# Patient Record
Sex: Male | Born: 1990 | Race: Black or African American | Hispanic: No | Marital: Single | Smoking: Never smoker
Health system: Southern US, Community
[De-identification: ages and names within clinical notes are randomized; demographics above are authoritative.]

## PROBLEM LIST (undated history)

## (undated) DIAGNOSIS — M1990 Unspecified osteoarthritis, unspecified site: Secondary | ICD-10-CM

---

## 2012-11-14 ENCOUNTER — Emergency Department (HOSPITAL_COMMUNITY)
Admission: EM | Admit: 2012-11-14 | Discharge: 2012-11-14 | Disposition: A | Discharge status: Home / Self Care | Payer: BC Managed Care – PPO | Attending: Emergency Medicine | Admitting: Emergency Medicine

## 2012-11-14 DIAGNOSIS — S61209A Unspecified open wound of unspecified finger without damage to nail, initial encounter: Secondary | ICD-10-CM | POA: Insufficient documentation

## 2012-11-14 DIAGNOSIS — Y929 Unspecified place or not applicable: Secondary | ICD-10-CM | POA: Insufficient documentation

## 2012-11-14 DIAGNOSIS — Y93G9 Activity, other involving cooking and grilling: Secondary | ICD-10-CM | POA: Insufficient documentation

## 2012-11-14 DIAGNOSIS — S61219A Laceration without foreign body of unspecified finger without damage to nail, initial encounter: Secondary | ICD-10-CM

## 2012-11-14 DIAGNOSIS — Z23 Encounter for immunization: Secondary | ICD-10-CM | POA: Insufficient documentation

## 2012-11-14 DIAGNOSIS — W268XXA Contact with other sharp object(s), not elsewhere classified, initial encounter: Secondary | ICD-10-CM | POA: Insufficient documentation

## 2012-11-14 MED ORDER — TETANUS-DIPHTH-ACELL PERTUSSIS 5-2.5-18.5 LF-MCG/0.5 IM SUSP
0.5000 mL | Freq: Once | INTRAMUSCULAR | Status: AC
  Administered 2012-11-14: 0.5 mL via INTRAMUSCULAR
  Filled 2012-11-14: qty 0.5

## 2012-11-14 MED ORDER — HYDROCODONE-ACETAMINOPHEN 5-325 MG PO TABS: 2.0000 | ORAL_TABLET | ORAL | Status: DC | PRN

## 2012-11-14 NOTE — ED Provider Notes (Signed)
History     CSN: [id number]  Arrival date & time 11/14/12  6295   First MD Initiated Contact with Patient 11/14/12 1004      Chief Complaint  Patient presents with  . Laceration    (Consider location/radiation/quality/duration/timing/severity/associated sxs/prior treatment) HPI Comments: Patient is a 21 year old male who presents with a laceration of his right thumb. The laceration occurred when he was using a mandolin slicer for cooking and accidentally cut his thumb. Patient reports sudden onset of mild, aching pain of his right thumb that does not radiate. Patient reports associated bleeding. He denies any weakness, numbness/tingling of his affected finger. Patient reports covering the wound to control bleeding. No aggravating/alleviating factors. No associated symptoms.    No past medical history on file.  No past surgical history on file.  No family history on file.  History  Substance Use Topics  . Smoking status: Not on file  . Smokeless tobacco: Not on file  . Alcohol Use: Not on file      Review of Systems  Skin: Positive for wound.  All other systems reviewed and are negative.    Allergies  Review of patient's allergies indicates no known allergies.  Home Medications   Current Outpatient Rx  Name  Route  Sig  Dispense  Refill  . IBUPROFEN 200 MG PO TABS   Oral   Take 400 mg by mouth every 6 (six) hours as needed. Headache           BP 156/82  Pulse 64  Temp 98.2 F (36.8 C) (Oral)  Resp 16  SpO2 100%  Physical Exam  Nursing note and vitals reviewed. Constitutional: He is oriented to person, place, and time. He appears well-developed and well-nourished. No distress.  HENT:  Head: Normocephalic and atraumatic.  Eyes: Conjunctivae normal and EOM are normal.  Neck: Normal range of motion.  Cardiovascular: Normal rate and regular rhythm.  Exam reveals no gallop and no friction rub.   No murmur heard. Pulmonary/Chest: Effort normal and breath  sounds normal. He has no wheezes. He has no rales. He exhibits no tenderness.  Abdominal: Soft. He exhibits no distension.  Musculoskeletal: Normal range of motion.       Affected finger tender to palpation at wound site. Full ROM of right thumb at each joint.   Neurological: He is alert and oriented to person, place, and time. Coordination normal.       Strength and sensation equal and intact bilaterally. Speech is goal-oriented. Moves limbs without ataxia.   Skin: Skin is warm and dry. He is not diaphoretic.       1.5 cm laceration to distal right thumb. Bleeding controlled.   Psychiatric: He has a normal mood and affect. His behavior is normal.    ED Course  Procedures (including critical care time)  LACERATION REPAIR Performed by: Emilia Beck Authorized by: Emilia Beck Consent: Verbal consent obtained. Risks and benefits: risks, benefits and alternatives were discussed Consent given by: patient Patient identity confirmed: provided demographic data Prepped and Draped in normal sterile fashion Wound explored  Laceration Location: right thumb  Laceration Length: 1.5 cm  No Foreign Bodies seen or palpated  Anesthesia: digital block  Local anesthetic: lidocaine 2% without epinephrine  Anesthetic total: 3 ml  Irrigation method: syringe Amount of cleaning: standard  Skin closure: 3-0 prolene  Number of sutures: 4  Technique: simple  Patient tolerance: Patient tolerated the procedure well with no immediate complications.   Labs Reviewed -  No data to display No results found.   1. Laceration of finger       MDM  11:26 AM Laceration repaired without difficulty. No neurovascular compromise. Patient given tdap. Patient will return in 10 days for suture removal. He will be discharged with pain medication and no further evaluation.         Emilia Beck, [place] 11/15/12 [phone]

## 2012-11-14 NOTE — ED Notes (Signed)
Pt has laceration to right thumb going through nail bed. Suture cart at bedside.

## 2012-11-15 NOTE — ED Provider Notes (Signed)
Medical screening examination/treatment/procedure(s) were performed by non-physician practitioner and as supervising physician I was immediately available for consultation/collaboration.   Lyanne Co, MD 11/15/12 [phone]

## 2013-12-31 ENCOUNTER — Emergency Department (HOSPITAL_COMMUNITY): Payer: BC Managed Care – PPO

## 2013-12-31 ENCOUNTER — Encounter (HOSPITAL_COMMUNITY): Payer: Self-pay | Admitting: Emergency Medicine

## 2013-12-31 ENCOUNTER — Emergency Department (HOSPITAL_COMMUNITY)
Admission: EM | Admit: 2013-12-31 | Discharge: 2013-12-31 | Disposition: A | Discharge status: Home / Self Care | Payer: BC Managed Care – PPO | Attending: Emergency Medicine | Admitting: Emergency Medicine

## 2013-12-31 DIAGNOSIS — K5289 Other specified noninfective gastroenteritis and colitis: Secondary | ICD-10-CM | POA: Insufficient documentation

## 2013-12-31 DIAGNOSIS — M129 Arthropathy, unspecified: Secondary | ICD-10-CM | POA: Insufficient documentation

## 2013-12-31 DIAGNOSIS — K529 Noninfective gastroenteritis and colitis, unspecified: Secondary | ICD-10-CM

## 2013-12-31 DIAGNOSIS — H9209 Otalgia, unspecified ear: Secondary | ICD-10-CM | POA: Insufficient documentation

## 2013-12-31 HISTORY — DX: Unspecified osteoarthritis, unspecified site: M19.90

## 2013-12-31 LAB — CBC WITH DIFFERENTIAL/PLATELET
BASOS ABS: 0 10*3/uL (ref 0.0–0.1)
BASOS PCT: 0 % (ref 0–1)
EOS PCT: 1 % (ref 0–5)
Eosinophils Absolute: 0.1 10*3/uL (ref 0.0–0.7)
HEMATOCRIT: 48.9 % (ref 39.0–52.0)
Hemoglobin: 16.1 g/dL (ref 13.0–17.0)
Lymphocytes Relative: 20 % (ref 12–46)
Lymphs Abs: 1.3 10*3/uL (ref 0.7–4.0)
MCH: 28.6 pg (ref 26.0–34.0)
MCHC: 32.9 g/dL (ref 30.0–36.0)
MCV: 86.9 fL (ref 78.0–100.0)
MONO ABS: 0.8 10*3/uL (ref 0.1–1.0)
Monocytes Relative: 13 % — ABNORMAL HIGH (ref 3–12)
Neutro Abs: 4.3 10*3/uL (ref 1.7–7.7)
Neutrophils Relative %: 66 % (ref 43–77)
Platelets: 202 10*3/uL (ref 150–400)
RBC: 5.63 MIL/uL (ref 4.22–5.81)
RDW: 12.9 % (ref 11.5–15.5)
WBC: 6.5 10*3/uL (ref 4.0–10.5)

## 2013-12-31 LAB — COMPREHENSIVE METABOLIC PANEL
ALBUMIN: 4.6 g/dL (ref 3.5–5.2)
ALT: 28 U/L (ref 0–53)
AST: 43 U/L — AB (ref 0–37)
Alkaline Phosphatase: 78 U/L (ref 39–117)
BUN: 10 mg/dL (ref 6–23)
CALCIUM: 9.8 mg/dL (ref 8.4–10.5)
CO2: 26 mEq/L (ref 19–32)
CREATININE: 1.29 mg/dL (ref 0.50–1.35)
Chloride: 101 mEq/L (ref 96–112)
GFR calc Af Amer: 89 mL/min — ABNORMAL LOW (ref >90)
GFR calc non Af Amer: 77 mL/min — ABNORMAL LOW (ref >90)
Glucose, Bld: 109 mg/dL — ABNORMAL HIGH (ref 70–99)
Potassium: 4.9 mEq/L (ref 3.7–5.3)
Sodium: 141 mEq/L (ref 137–147)
TOTAL PROTEIN: 8.5 g/dL — AB (ref 6.0–8.3)
Total Bilirubin: 1 mg/dL (ref 0.3–1.2)

## 2013-12-31 LAB — LIPASE, BLOOD: Lipase: 22 U/L (ref 11–59)

## 2013-12-31 MED ORDER — MORPHINE SULFATE 4 MG/ML IJ SOLN
4.0000 mg | Freq: Once | INTRAMUSCULAR | Status: AC
  Administered 2013-12-31: 4 mg via INTRAVENOUS
  Filled 2013-12-31: qty 1

## 2013-12-31 MED ORDER — ONDANSETRON HCL 4 MG/2ML IJ SOLN
4.0000 mg | Freq: Once | INTRAMUSCULAR | Status: AC
  Administered 2013-12-31: 4 mg via INTRAVENOUS
  Filled 2013-12-31: qty 2

## 2013-12-31 MED ORDER — HYDROCODONE-ACETAMINOPHEN 5-325 MG PO TABS: 1.0000 | ORAL_TABLET | ORAL | Status: AC | PRN

## 2013-12-31 MED ORDER — ONDANSETRON 8 MG PO TBDP: ORAL_TABLET | ORAL | Status: AC

## 2013-12-31 MED ORDER — IOHEXOL 300 MG/ML  SOLN
100.0000 mL | Freq: Once | INTRAMUSCULAR | Status: AC | PRN
  Administered 2013-12-31: 100 mL via INTRAVENOUS

## 2013-12-31 MED ORDER — SODIUM CHLORIDE 0.9 % IV BOLUS (SEPSIS)
1000.0000 mL | Freq: Once | INTRAVENOUS | Status: AC
  Administered 2013-12-31: 1000 mL via INTRAVENOUS

## 2013-12-31 MED ORDER — IOHEXOL 300 MG/ML  SOLN
50.0000 mL | Freq: Once | INTRAMUSCULAR | Status: AC | PRN
  Administered 2013-12-31: 50 mL via ORAL

## 2013-12-31 NOTE — ED Provider Notes (Signed)
CSN: [id number]     Arrival date & time 12/31/13  1501 History   First MD Initiated Contact with Patient 12/31/13 1540     Chief Complaint  Patient presents with  . Abdominal Pain   (Consider location/radiation/quality/duration/timing/severity/associated sxs/prior Treatment) The history is provided by the patient and medical records. No language interpreter was used.    Brandon Noble is a 23 y.o. male  with a hx of arthritis presents to the Emergency Department complaining of gradual, persistent, progressively worsening periumbilical and RLQ abd pain with associated watery diarrhea x 7 today onset Friday evening (2 days).  Pt reports subjective fevers and chills.  He reports that the initial pain was in his lower back with associated myalgias.  Pt reports intense cardiac activity and he thought he was just sore and coming down with the flu.  He denies getting a flu shot and his entire office has had it.  Associated symptoms include L ear otalgia/ringing. Pt reports he took dayquil and imodium without relief.  Nothing makes it better and pushing on the abd makes it worse.  Pt denies headache, neck pain, chest pain, SOB, cough, nasal congestion, ST, vomiting, weakness, dizziness, syncope, dysuria.      Past Medical History  Diagnosis Date  . Arthritis     childhood   History reviewed. No pertinent past surgical history. No family history on file. History  Substance Use Topics  . Smoking status: Never Smoker   . Smokeless tobacco: Not on file  . Alcohol Use: Yes     Comment: social    Review of Systems  Constitutional: Negative for fever, diaphoresis, appetite change, fatigue and unexpected weight change.  HENT: Negative for mouth sores and trouble swallowing.   Respiratory: Negative for cough, chest tightness, shortness of breath, wheezing and stridor.   Cardiovascular: Negative for chest pain and palpitations.  Gastrointestinal: Positive for nausea, abdominal pain and diarrhea. Negative  for vomiting, constipation, blood in stool, abdominal distention and rectal pain.  Genitourinary: Negative for dysuria, urgency, frequency, hematuria, flank pain and difficulty urinating.  Musculoskeletal: Positive for back pain and myalgias. Negative for neck pain and neck stiffness.  Skin: Negative for rash.  Neurological: Negative for weakness.  Hematological: Negative for adenopathy.  Psychiatric/Behavioral: Negative for confusion.  All other systems reviewed and are negative.    Allergies  Review of patient's allergies indicates no known allergies.  Home Medications   Current Outpatient Rx  Name  Route  Sig  Dispense  Refill  . alum & mag hydroxide-simeth (MAALOX/MYLANTA) 200-200-20 MG/5ML suspension   Oral   Take 30 mLs by mouth every 6 (six) hours as needed for indigestion or heartburn.         . Pseudoephedrine-APAP-DM (DAYQUIL MULTI-SYMPTOM COLD/FLU PO)   Oral   Take 30 mLs by mouth every 6 (six) hours as needed (flu symptoms).         Marland Kitchen HYDROcodone-acetaminophen (NORCO/VICODIN) 5-325 MG per tablet   Oral   Take 1-2 tablets by mouth every 4 (four) hours as needed.   10 tablet   0   . ondansetron (ZOFRAN ODT) 8 MG disintegrating tablet      8mg  ODT q4 hours prn nausea   10 tablet   0    BP 139/79  Pulse 83  Temp(Src) 98.1 F (36.7 C)  Resp 20  Ht 6' (1.829 m)  Wt 265 lb (120.203 kg)  BMI 35.93 kg/m2  SpO2 98% Physical Exam  Nursing note and vitals reviewed. Constitutional:  He is oriented to person, place, and time. He appears well-developed and well-nourished. No distress.  Awake, alert, nontoxic appearance  HENT:  Head: Normocephalic and atraumatic.  Right Ear: Tympanic membrane, external ear and ear canal normal.  Left Ear: Tympanic membrane, external ear and ear canal normal.  Nose: Nose normal. No mucosal edema or rhinorrhea.  Mouth/Throat: Uvula is midline, oropharynx is clear and moist and mucous membranes are normal. Mucous membranes are not  dry. No uvula swelling. No oropharyngeal exudate, posterior oropharyngeal edema, posterior oropharyngeal erythema or tonsillar abscesses.  Eyes: Conjunctivae are normal. No scleral icterus.  Neck: Normal range of motion. Neck supple.  Cardiovascular: Normal rate, regular rhythm, normal heart sounds and intact distal pulses.   No murmur heard. No tachycardia  Pulmonary/Chest: Effort normal and breath sounds normal. No respiratory distress. He has no wheezes.  Clear and equal breath sounds  Abdominal: Soft. Bowel sounds are normal. He exhibits no distension and no mass. There is tenderness in the right upper quadrant, right lower quadrant and periumbilical area. There is guarding. There is no rebound and no CVA tenderness.  Patient with tenderness to palpation along the right lateral side of the abdomen and around the period the local area, some guarding with exam. No CVA tenderness or rebound tenderness  No abdominal pain with heeltap, no peritoneal signs  Musculoskeletal: Normal range of motion. He exhibits no edema.  Neurological: He is alert and oriented to person, place, and time. He exhibits normal muscle tone. Coordination normal.  Speech is clear and goal oriented Moves extremities without ataxia  Skin: Skin is warm and dry. He is not diaphoretic. No erythema.  Psychiatric: He has a normal mood and affect.    ED Course  Procedures (including critical care time) Labs Review Labs Reviewed  CBC WITH DIFFERENTIAL - Abnormal; Notable for the following:    Monocytes Relative 13 (*)    All other components within normal limits  COMPREHENSIVE METABOLIC PANEL - Abnormal; Notable for the following:    Glucose, Bld 109 (*)    Total Protein 8.5 (*)    AST 43 (*)    GFR calc non Af Amer 77 (*)    GFR calc Af Amer 89 (*)    All other components within normal limits  LIPASE, BLOOD  URINALYSIS, ROUTINE W REFLEX MICROSCOPIC   Imaging Review Ct Abdomen Pelvis W Contrast  12/31/2013    CLINICAL DATA:  Right lower quadrant pain, fever  EXAM: CT ABDOMEN AND PELVIS WITH CONTRAST  TECHNIQUE: Multidetector CT imaging of the abdomen and pelvis was performed using the standard protocol following bolus administration of intravenous contrast. Sagittal and coronal MPR images reconstructed from axial data set.  CONTRAST:  OMNIPAQUE IOHEXOL 300 MG/ML SOLN. No/insufficient oral contrast.  COMPARISON:  None  FINDINGS: Lung bases clear.  Liver, spleen, pancreas, kidneys, and adrenal glands normal appearance.  Fluid filled normal upper normal caliber small bowel loops throughout abdomen.  Scattered fluid throughout colon.  No hydronephrosis or ureteral dilatation.  Bladder unremarkable.  Appendix not visualized but no pericecal inflammatory process identified.  No mass, adenopathy, free fluid or free air.  Bones unremarkable.  IMPRESSION: Nonspecific fluid filled nondistended large and small bowel loops throughout abdomen.  No other intra-abdominal or intrapelvic abnormalities identified.   Electronically Signed   By: Ulyses Southward M.D.   On: 12/31/2013 17:28    EKG Interpretation   None       MDM   1. Gastroenteritis  Gaylyn CheersDevin Bechtel presents with right-sided abdominal pain from urgent care after 2 days of back pain and watery diarrhea. He denies nausea or vomiting. Subjective fevers at home.  Patient is afebrile and not tachycardic here.  He relates concerns that he had the flu as he has been exposed to it however he presents without URI symptoms.  Patient now reports right upper quadrant pain however patient relates right lower quadrant pain.  Patient with tenderness along the entire right lateral side of the abdomen, worse in the right lower quadrant. Plan for fluids, pain control, basic labs and CT abd pelvis.    6:11 PM Pt with resolution of symptoms after pain control, nausea control and fluids.  On reevaluation abdomen soft and nontender.  Patient without leukocytosis, CMP  unremarkable, lipase within normal limits and CT abdomen pelvis without evidence of diverticulitis, cholecystitis or appendicitis.  Fluid-filled small bowel loops, nondistended.  I personally reviewed the imaging tests through PACS system.  I reviewed available ER/hospitalization records through the EMR.    Patient with symptoms consistent with viral gastroenteritis.  Vitals are stable, no fever.  Patient is nontoxic, nonseptic appearing, in no apparent distress.  Patient does not meet the SIRS or Sepsis criteria.  Pt's symptoms have been managed in the department; fluid bolus given.  No signs of dehydration, tolerating PO fluids > 6 oz.  Lungs are clear.  No focal abdominal pain, no peritoneal signs, no concern for appendicitis, cholecystitis, pancreatitis, ruptured viscus, UTI, kidney stone.  Supportive therapy indicated with return if symptoms worsen.  Patient counseled.  I have also discussed reasons to return immediately to the ER.  Patient expresses understanding and agrees with plan.  It has been determined that no acute conditions requiring further emergency intervention are present at this time. The patient/guardian have been advised of the diagnosis and plan. We have discussed signs and symptoms that warrant return to the ED, such as changes or worsening in symptoms.   Vital signs are stable at discharge.   BP 139/79  Pulse 83  Temp(Src) 98.1 F (36.7 C)  Resp 20  Ht 6' (1.829 m)  Wt 265 lb (120.203 kg)  BMI 35.93 kg/m2  SpO2 98%  Patient/guardian has voiced understanding and agreed to follow-up with the PCP or specialist.        Dierdre ForthHannah Latoya Diskin, PA-C 12/31/13 1813  Dierdre ForthHannah Lenyx Boody, PA-C 12/31/13 1815

## 2013-12-31 NOTE — ED Notes (Signed)
Pt attempted to urinate but was unsuccessful at this time

## 2013-12-31 NOTE — ED Notes (Signed)
Pt from [facility] to be evaluated for RUQ pain that radiates to back x2 days with diarrhea, denies N/V. Pt is A&O and in NAD

## 2013-12-31 NOTE — Discharge Instructions (Signed)
1. Medications: zofran for nausea, Vicodin for pain, usual home medications 2. Treatment: rest, drink plenty of fluids, imodium for diarrhea as needed 3. Follow Up: Please followup with your primary doctor for discussion of your diagnoses and further evaluation after today's visit; if you do not have a primary care doctor use the resource guide provided to find one; return to the Ed for worsening or concerning symptoms  Viral Gastroenteritis Viral gastroenteritis is also known as stomach flu. This condition affects the stomach and intestinal tract. It can cause sudden diarrhea and vomiting. The illness typically lasts 3 to 8 days. Most people develop an immune response that eventually gets rid of the virus. While this natural response develops, the virus can make you quite ill. CAUSES  Many different viruses can cause gastroenteritis, such as rotavirus or noroviruses. You can catch one of these viruses by consuming contaminated food or water. You may also catch a virus by sharing utensils or other personal items with an infected person or by touching a contaminated surface. SYMPTOMS  The most common symptoms are diarrhea and vomiting. These problems can cause a severe loss of body fluids (dehydration) and a body salt (electrolyte) imbalance. Other symptoms may include:  Fever.  Headache.  Fatigue.  Abdominal pain. DIAGNOSIS  Your caregiver can usually diagnose viral gastroenteritis based on your symptoms and a physical exam. A stool sample may also be taken to test for the presence of viruses or other infections. TREATMENT  This illness typically goes away on its own. Treatments are aimed at rehydration. The most serious cases of viral gastroenteritis involve vomiting so severely that you are not able to keep fluids down. In these cases, fluids must be given through an intravenous line (IV). HOME CARE INSTRUCTIONS   Drink enough fluids to keep your urine clear or pale yellow. Drink small  amounts of fluids frequently and increase the amounts as tolerated.  Ask your caregiver for specific rehydration instructions.  Avoid:  Foods high in sugar.  Alcohol.  Carbonated drinks.  Tobacco.  Juice.  Caffeine drinks.  Extremely hot or cold fluids.  Fatty, greasy foods.  Too much intake of anything at one time.  Dairy products until 24 to 48 hours after diarrhea stops.  You may consume probiotics. Probiotics are active cultures of beneficial bacteria. They may lessen the amount and number of diarrheal stools in adults. Probiotics can be found in yogurt with active cultures and in supplements.  Wash your hands well to avoid spreading the virus.  Only take over-the-counter or prescription medicines for pain, discomfort, or fever as directed by your caregiver. Do not give aspirin to children. Antidiarrheal medicines are not recommended.  Ask your caregiver if you should continue to take your regular prescribed and over-the-counter medicines.  Keep all follow-up appointments as directed by your caregiver. SEEK IMMEDIATE MEDICAL CARE IF:   You are unable to keep fluids down.  You do not urinate at least once every 6 to 8 hours.  You develop shortness of breath.  You notice blood in your stool or vomit. This may look like coffee grounds.  You have abdominal pain that increases or is concentrated in one small area (localized).  You have persistent vomiting or diarrhea.  You have a fever.  The patient is a child younger than 3 months, and he or she has a fever.  The patient is a child older than 3 months, and he or she has a fever and persistent symptoms.  The patient is  a child older than 3 months, and he or she has a fever and symptoms suddenly get worse.  The patient is a baby, and he or she has no tears when crying. MAKE SURE YOU:   Understand these instructions.  Will watch your condition.  Will get help right away if you are not doing well or get  worse. Document Released: 11/16/2005 Document Revised: 02/08/2012 Document Reviewed: 09/02/2011 [facility] Patient Information 2014 [place], [place].   Diet for Diarrhea, Adult Frequent, runny stools (diarrhea) may be caused or worsened by food or drink. Diarrhea may be relieved by changing your diet. Since diarrhea can last up to 7 days, it is easy for you to lose too much fluid from the body and become dehydrated. Fluids that are lost need to be replaced. Along with a modified diet, make sure you drink enough fluids to keep your urine clear or pale yellow. DIET INSTRUCTIONS  Ensure adequate fluid intake (hydration): have 1 cup (8 oz) of fluid for each diarrhea episode. Avoid fluids that contain simple sugars or sports drinks, fruit juices, whole milk products, and sodas. Your urine should be clear or pale yellow if you are drinking enough fluids. Hydrate with an oral rehydration solution that you can purchase at pharmacies, retail stores, and online. You can prepare an oral rehydration solution at home by mixing the following ingredients together:    tsp table salt.   tsp baking soda.   tsp salt substitute containing potassium chloride.  1  tablespoons sugar.  1 L (34 oz) of water.  Certain foods and beverages may increase the speed at which food moves through the gastrointestinal (GI) tract. These foods and beverages should be avoided and include:  Caffeinated and alcoholic beverages.  High-fiber foods, such as raw fruits and vegetables, nuts, seeds, and whole grain breads and cereals.  Foods and beverages sweetened with sugar alcohols, such as xylitol, sorbitol, and mannitol.  Some foods may be well tolerated and may help thicken stool including:  Starchy foods, such as rice, toast, pasta, low-sugar cereal, oatmeal, grits, baked potatoes, crackers, and bagels.   Bananas.   Applesauce.  Add probiotic-rich foods to help increase healthy bacteria in the GI tract, such as yogurt  and fermented milk products. RECOMMENDED FOODS AND BEVERAGES Starches Choose foods with less than 2 g of fiber per serving.  Recommended:  White, [place], and pita breads, plain rolls, buns, bagels. Plain muffins, matzo. Soda, saltine, or graham crackers. Pretzels, melba toast, zwieback. Cooked cereals made with water: cornmeal, farina, cream cereals. Dry cereals: refined corn, wheat, rice. Potatoes prepared any way without skins, refined macaroni, spaghetti, noodles, refined rice.  Avoid:  Bread, rolls, or crackers made with whole wheat, multi-grains, rye, bran seeds, nuts, or coconut. Corn tortillas or taco shells. Cereals containing whole grains, multi-grains, bran, coconut, nuts, raisins. Cooked or dry oatmeal. Coarse wheat cereals, granola. Cereals advertised as "high-fiber." Potato skins. Whole grain pasta, wild or brown rice. Popcorn. Sweet potatoes, yams. Sweet rolls, doughnuts, waffles, pancakes, sweet breads. Vegetables  Recommended: Strained tomato and vegetable juices. Most well-cooked and canned vegetables without seeds. Fresh: Tender lettuce, cucumber without the skin, cabbage, spinach, bean sprouts.  Avoid: Fresh, cooked, or canned: Artichokes, baked beans, beet greens, broccoli, Brussels sprouts, corn, kale, legumes, peas, sweet potatoes. Cooked: Green or red cabbage, spinach. Avoid large servings of any vegetables because vegetables shrink when cooked, and they contain more fiber per serving than fresh vegetables. Fruit  Recommended: Cooked or canned: Apricots, applesauce, cantaloupe, cherries,  fruit cocktail, grapefruit, grapes, kiwi, mandarin oranges, peaches, pears, plums, watermelon. Fresh: Apples without skin, ripe banana, grapes, cantaloupe, cherries, grapefruit, peaches, oranges, plums. Keep servings limited to  cup or 1 piece.  Avoid: Fresh: Apples with skin, apricots, mangoes, pears, raspberries, strawberries. Prune juice, stewed or dried prunes. Dried fruits, raisins,  dates. Large servings of all fresh fruits. Protein  Recommended: Ground or well-cooked tender beef, ham, veal, lamb, pork, or poultry. Eggs. Fish, oysters, shrimp, lobster, other seafoods. Liver, organ meats.  Avoid: Tough, fibrous meats with gristle. Peanut butter, smooth or chunky. Cheese, nuts, seeds, legumes, dried peas, beans, lentils. Dairy  Recommended: Yogurt, lactose-free milk, kefir, drinkable yogurt, buttermilk, soy milk, or plain hard cheese.  Avoid: Milk, chocolate milk, beverages made with milk, such as milkshakes. Soups  Recommended: Bouillon, broth, or soups made from allowed foods. Any strained soup.  Avoid: Soups made from vegetables that are not allowed, cream or milk-based soups. Desserts and Sweets  Recommended: Sugar-free gelatin, sugar-free frozen ice pops made without sugar alcohol.  Avoid: Plain cakes and cookies, pie made with fruit, pudding, custard, cream pie. Gelatin, fruit, ice, sherbet, frozen ice pops. Ice cream, ice milk without nuts. Plain hard candy, honey, jelly, molasses, syrup, sugar, chocolate syrup, gumdrops, marshmallows. Fats and Oils  Recommended: Limit fats to less than 8 tsp per day.  Avoid: Seeds, nuts, olives, avocados. Margarine, butter, cream, mayonnaise, salad oils, plain salad dressings. Plain gravy, crisp bacon without rind. Beverages  Recommended: Water, decaffeinated teas, oral rehydration solutions, sugar-free beverages not sweetened with sugar alcohols.  Avoid: Fruit juices, caffeinated beverages (coffee, tea, soda), alcohol, sports drinks, or lemon-lime soda. Condiments  Recommended: Ketchup, mustard, horseradish, vinegar, cocoa powder. Spices in moderation: allspice, basil, bay leaves, celery powder or leaves, cinnamon, cumin powder, curry powder, ginger, mace, marjoram, onion or garlic powder, oregano, paprika, parsley flakes, ground pepper, rosemary, sage, savory, tarragon, thyme, turmeric.  Avoid: Coconut,  honey. Document Released: 02/06/2004 Document Revised: 08/10/2012 Document Reviewed: 04/01/2012 [facility] Patient Information 2014 [place], [place].    Emergency Department Resource Guide 1) Find a Doctor and Pay Out of Pocket Although you won't have to find out who is covered by your insurance plan, it is a good idea to ask around and get recommendations. You will then need to call the office and see if the doctor you have chosen will accept you as a new patient and what types of options they offer for patients who are self-pay. Some doctors offer discounts or will set up payment plans for their patients who do not have insurance, but you will need to ask so you aren't surprised when you get to your appointment.  2) Contact Your Local Health Department Not all health departments have doctors that can see patients for sick visits, but many do, so it is worth a call to see if yours does. If you don't know where your local health department is, you can check in your phone book. The CDC also has a tool to help you locate your state's health department, and many state websites also have listings of all of their local health departments.  3) Find a Walk-in Clinic If your illness is not likely to be very severe or complicated, you may want to try a walk in clinic. These are popping up all over the country in pharmacies, drugstores, and shopping centers. They're usually staffed by nurse practitioners or physician assistants that have been trained to treat common illnesses and complaints. They're usually fairly quick and inexpensive.  However, if you have serious medical issues or chronic medical problems, these are probably not your best option.  No Primary Care Doctor: - Call Health Connect at  [phone] - they can help you locate a primary care doctor that  accepts your insurance, provides certain services, etc. - Physician Referral Service- [phone]  Chronic Pain Problems: Organization          Address  Phone   Notes  [facility]  [phone] Patients need to be referred by their primary care doctor.   Medication Assistance: Organization         Address  Phone   Notes  [facility] Medication [facility] [address] [place], [place] [address] [phone] --Must be a resident of [facility] -- Must have NO insurance coverage whatsoever (no Medicaid/ Medicare, etc.) -- The pt. MUST have a primary care doctor that directs their care regularly and follows them in the community   MedAssist  [phone]   [employer]  [phone]    Agencies that provide inexpensive medical care: Organization         Address  Phone   Notes  [place]  [phone]   [facility]    [phone]   [facility] [place], [place] [address] [phone]   [place] [address] [place] [phone]   Planned Parenthood    [phone]   [facility]    [phone]   [place] and [place] [address], [place] Phone:  [phone], Fax:  [phone] Hours of Operation:  9 am - 6 pm, M-F.  Also accepts Medicaid/Medicare and self-pay.  [facility] for [place] [address], [address], [place] Phone: [phone], Fax: [phone]. Hours of Operation:  8:30 am - 5:30 pm, M-F.  Also accepts Medicaid and self-pay.  [facility] [place] [address], [place] Phone: [phone]   [place], [place], [place] [phone], Ext. [phone] Mondays & Thursdays: 7-9 AM.  First 15 patients are seen on a first come, first serve basis.    [place] Providers:  Organization         Address  Phone   Notes  [facility] [address], [place] [phone] Also accepts self-pay patients.  [facility] [id number] [place], [place]  [phone]   [place], [address], [place] [phone]   [facility] [address] [place] [phone]   Lucianne Lei [address], [place]   [phone] Only accepts [place] Access [place] patients after they have their name applied to their card.   Self-Pay (no insurance) in [facility]:  Organization         Address  Phone   Notes  Sickle Cell Patients, [facility] [place] [phone]   [facility] [place] [phone]   [facility] [place]  [place], [place], [place] [phone]   [facility]/Dr. Osei-Bonsu  [address] [place] or [address], [place] [phone] Phone number for both [place] and [place] locations is the same.  [facility] [address], [place] [phone]   [facility] [address], [place] or [address] [phone]   [facility] [address] [place] [phone], phone; [phone], fax Sees patients 1st and 3rd Saturday of every month.  Must not qualify for public or private insurance (i.e. Medicaid, Medicare, [place] Health Choice, Veterans' Benefits)  Household income should be no more than 200% of the poverty level The clinic cannot treat you if you are pregnant or think you are pregnant  Sexually transmitted diseases are not treated at the clinic.    Dental Care: Organization         Address  Phone  Notes  [facility] Department of [facility] [place] [phone] Accepts children up to age 77 who are enrolled in [place] or [place]; pregnant women with a Medicaid card; and  children who have applied for Medicaid or [place] Health Choice, but were declined, whose parents can pay a reduced fee at time of service.  [facility] Department of [facility]  [address], [place] [phone] Accepts children up to age 23 who are enrolled in [place] or [place]; pregnant women with a Medicaid card; and children who have applied for Medicaid or [place] Health Choice, but were declined, whose parents can pay a reduced fee at time of service.  [place] Adult Dental Access PROGRAM  [place] [phone] Patients are seen by appointment only. Walk-ins are not accepted. [place] will see patients 50 years of age and older. Monday - Tuesday (8am-5pm) Most Wednesdays (8:30-5pm) $30 per visit, cash only  [facility] Adult [facility] Access PROGRAM  [address], [facility] [phone] Patients are seen by appointment only. Walk-ins are not accepted. [place] will see patients 32 years of age and older. One Wednesday Evening (Monthly: Volunteer Based).  $30 per visit, cash only  [place]  [phone] for adults; Children under age 52, call Graduate Pediatric Dentistry at [phone]. Children aged 73-14, please call [phone] to request a pediatric application.  Dental services are provided in all areas of dental care including fillings, crowns and bridges, complete and partial dentures, implants, gum treatment, root canals, and extractions. Preventive care is also provided. Treatment is provided to both adults and children. Patients are selected via a lottery and there is often a waiting list.   [facility] [address], [place]  [phone] [email or web address]   [facility] [address] [place], [place] [phone], Ext. [phone] Second and Fourth Thursday of each month, opens at 6:30 AM; Clinic ends at 9 AM.  Patients are seen on a first-come first-served  basis, and a limited number are seen during each clinic.   [facility]  [address] [place] Danker [place], [place] [phone]   Eligibility Requirements You must have lived in [place], [place], or [place] counties for at least the last three months.   You cannot be eligible for state or federal sponsored [employer], including [employer], [place], or [employer].   You generally cannot be eligible for healthcare insurance through your employer.    How to apply: Eligibility screenings are held every Tuesday and Wednesday afternoon from 1:00 pm until 4:00 pm. You  do not need an appointment for the interview!  [facility] [address] [place], [place]   [place]  [place] Department  [place]  [phone]    Behavioral Health Resources in the Community: Intensive Outpatient Programs Organization         Address  Phone  Notes  [place]. [address], [place], [place] [phone]   [facility] [address] [place], [place]   ADS: Alcohol [facility] Drug [facility] [address], [place], [place]   [place] [address]  [place], [place] or [phone]   Substance Abuse Resources Organization         Address  Phone  Notes  Al[facility] Drug Services  [phone]   [place]  [phone]   [facility] [place]   Chinita Pester  [phone]   Residential & Outpatient Substance Abuse Program  [phone]   Psychological Services Organization         Address  Phone  Notes  [facility] [place]  [place]  [phone]   [place] [address] or [phone]    Mobile Crisis Teams Organization          Address  Phone  Notes  [facility], Mobile Crisis Care Unit  [phone]   [facility]  [address] [place], [place]   [place] [address], [place] [phone]    Self-Help/Support Groups Organization         Address  Phone             Notes  [place]. of [place] - variety of support groups  [place] Call for more information  Narcotics Anonymous (NA), [facility] [address], [employer] [place]  2 meetings at this location   Special educational needs teacher         Address  Phone  Notes  [facility]idential Treatment [place],    [place]  [phone]   [facility]  [address], [place] [id number], [place], [place]   [place], [place] [phone] Admissions: 8am-3pm M-F  Incentives Substance [place] [address]    [place], [place] [phone]   [facility] [address] [place], [place], [place]   [facility] [address]  [place], [place]   [facility] - Intensive Outpatient [address], [place], [place]   [facility] ([place] [address].,  [place], [place] [phone] or [phone]   [facility] Services (RTS) [address], [place], [place] Accepts Medicaid  [facility] [place] [address],  [place] [phone] Substance Abuse/Addiction Treatment   [facility] Organization         Address  Phone  Notes  [facility]  [phone]   Domenic Schwab, PhD [address] [place], [place]   [phone] or [phone]   [place], [place] [phone]   [place] [address] [place], [place] [phone] Insurance/Medicaid/sponsorship  through [employer] and Families [address] [id number]  [place], [place] [phone] [place], [place] [phone]    Dr. Adele Schilder  [phone]   [facility] Dept. 1) [address] [place] 2) [place] 3)  [place], [place] [phone]  [phone]   [place] [phone] or [phone] (After Hours)

## 2013-12-31 NOTE — ED Provider Notes (Signed)
Medical screening examination/treatment/procedure(s) were performed by non-physician practitioner and as supervising physician I was immediately available for consultation/collaboration.  EKG Interpretation   None         Lyanne CoKevin M Blakley Michna, MD 12/31/13 [id number]

## 2015-06-29 IMAGING — CT CT ABD-PELV W/ CM
1 of 2 series · 15 of 32 positions shown, 19 images · IV contrast (OMNIPAQUE 300)
Comparison: None

CLINICAL DATA: Right lower quadrant pain, fever

EXAM:
CT ABDOMEN AND PELVIS WITH CONTRAST
TECHNIQUE: Multidetector CT imaging of the abdomen and pelvis was performed
using the standard protocol following bolus administration of
intravenous contrast. Sagittal and coronal MPR images reconstructed
from axial data set.
CONTRAST:  100mL OMNIPAQUE IOHEXOL 300 MG/ML SOLN. No/insufficient
oral contrast.

[Series 2: abd/pel with · axial · 0.82mm/px · z∈[-254,+196]mm · 15 of 98 slices shown, 19 images]
[im 4/98  soft-tissue]
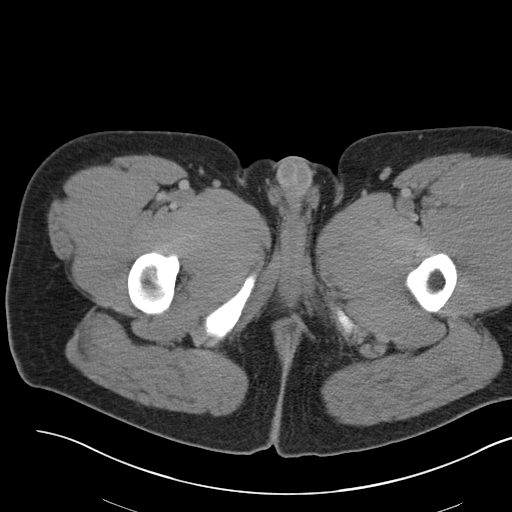
[im 4/98  bone]
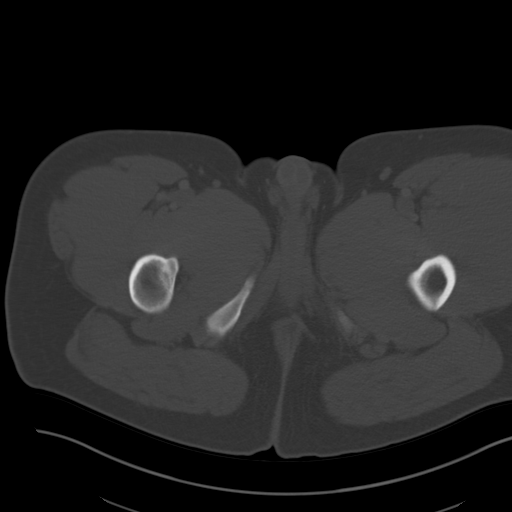
[im 12/98  soft-tissue]
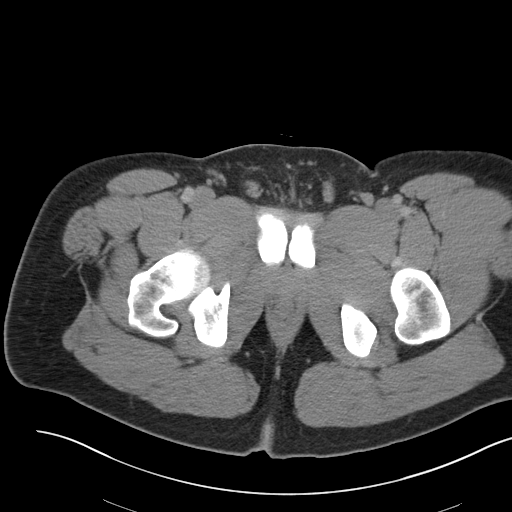
[im 19/98  soft-tissue]
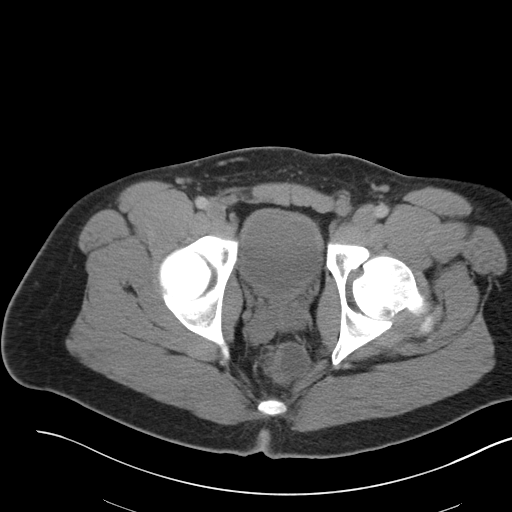
[im 27/98  soft-tissue]
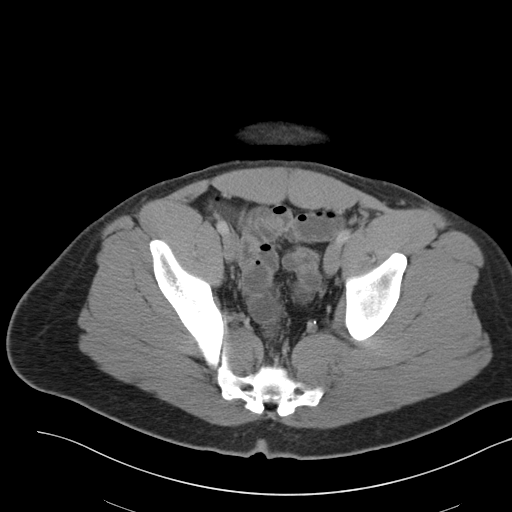
[im 34/98  soft-tissue]
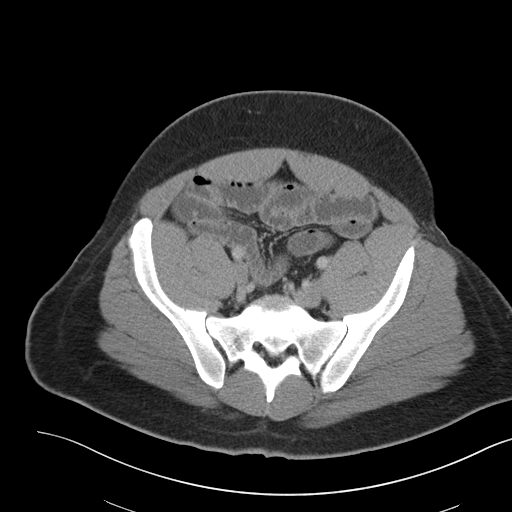
[im 42/98  soft-tissue]
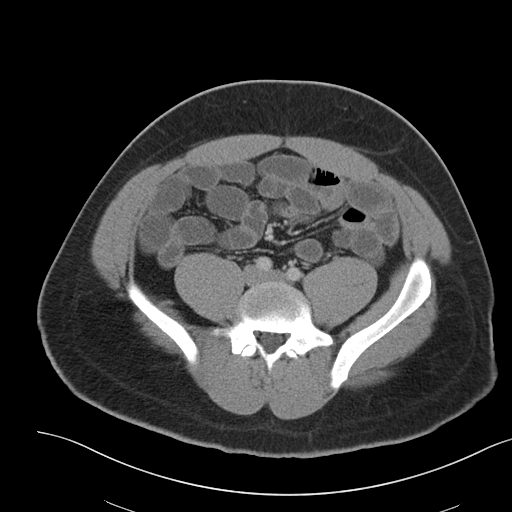
[im 49/98  soft-tissue]
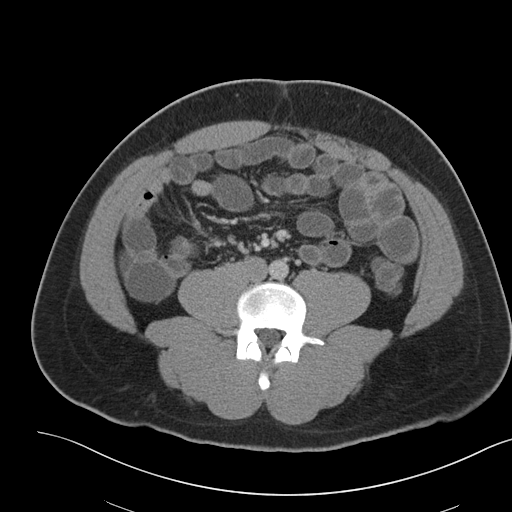
[im 56/98  soft-tissue]
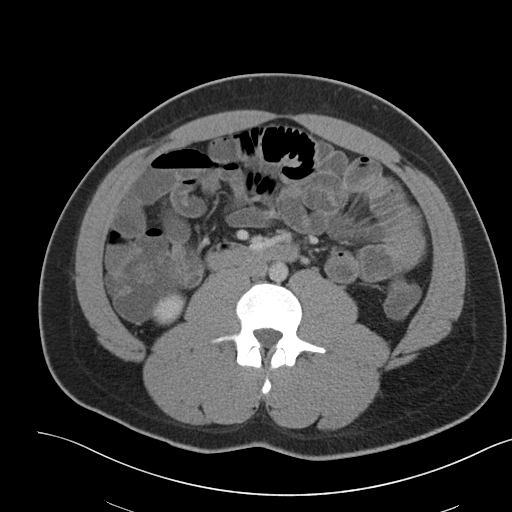
[im 64/98  soft-tissue]
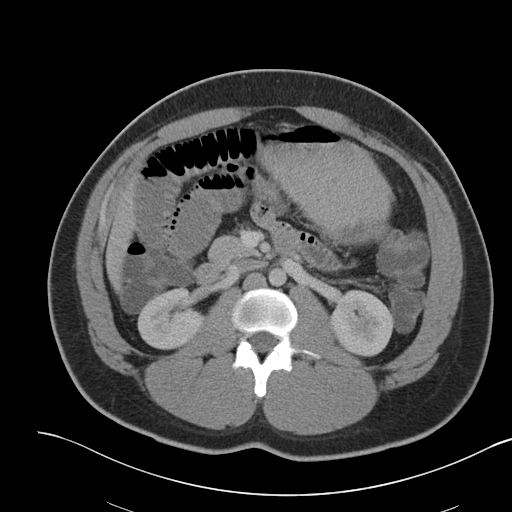
[im 64/98  bone]
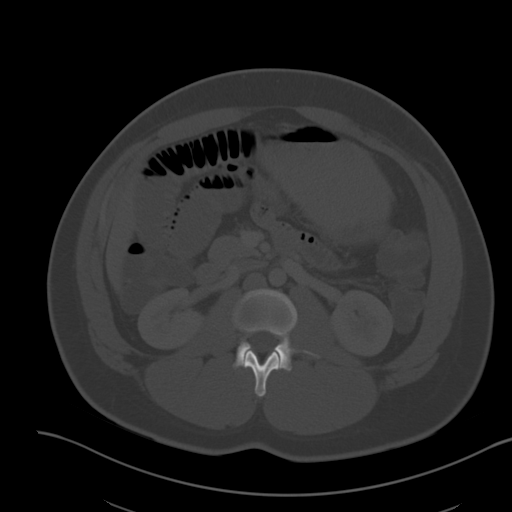
[im 71/98  soft-tissue]
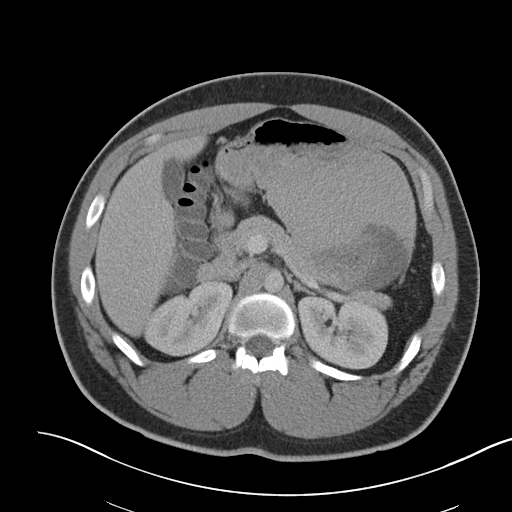
[im 79/98  soft-tissue]
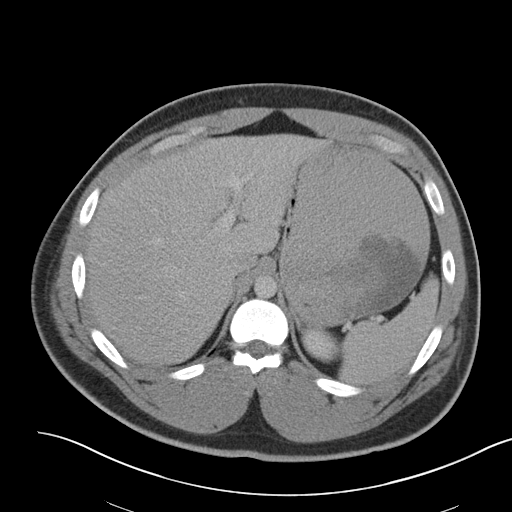
[im 83/98  lung]
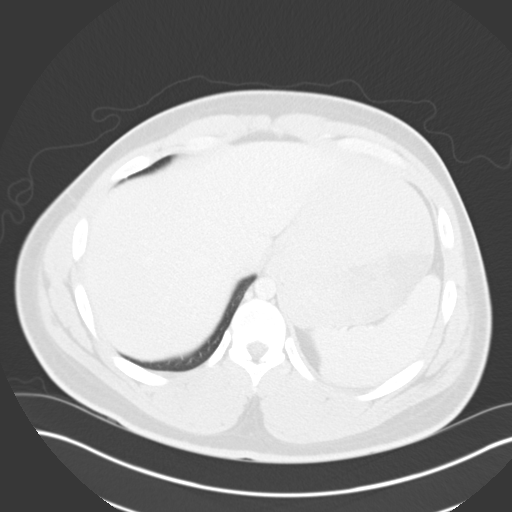
[im 86/98  soft-tissue]
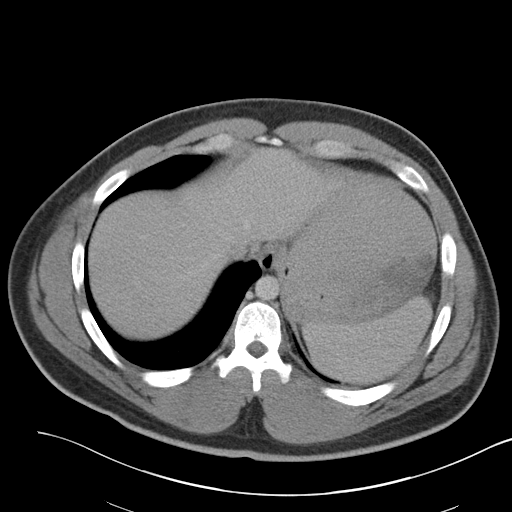
[im 86/98  lung]
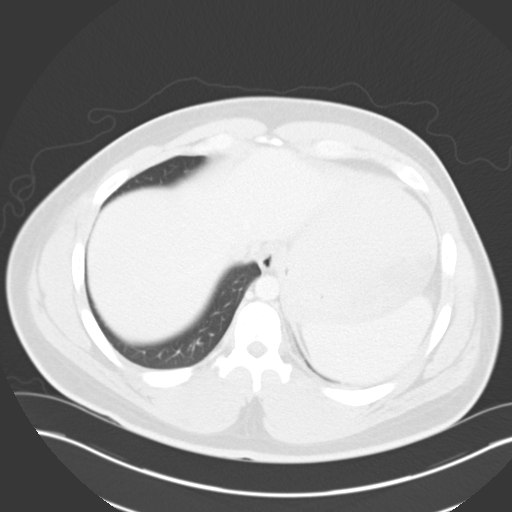
[im 90/98  lung]
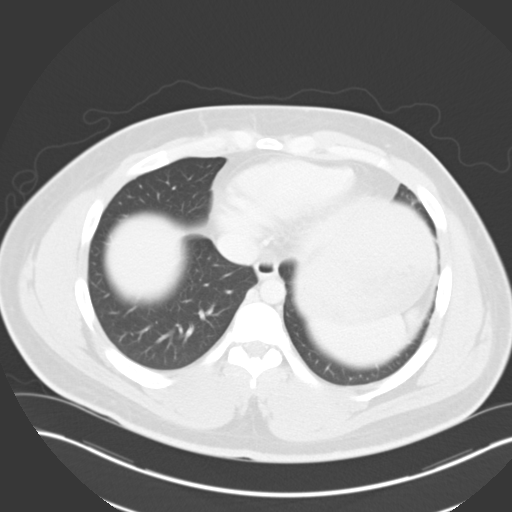
[im 94/98  soft-tissue]
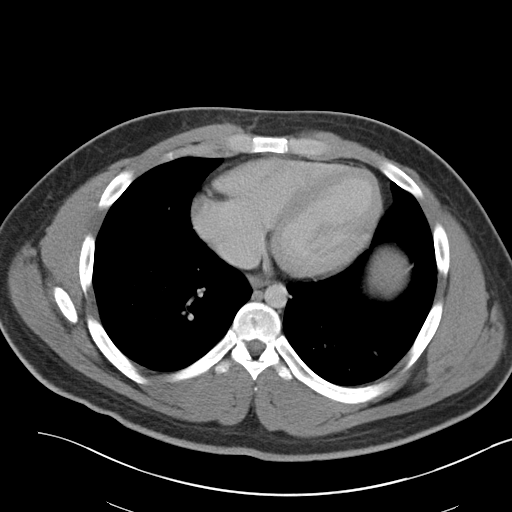
[im 94/98  lung]
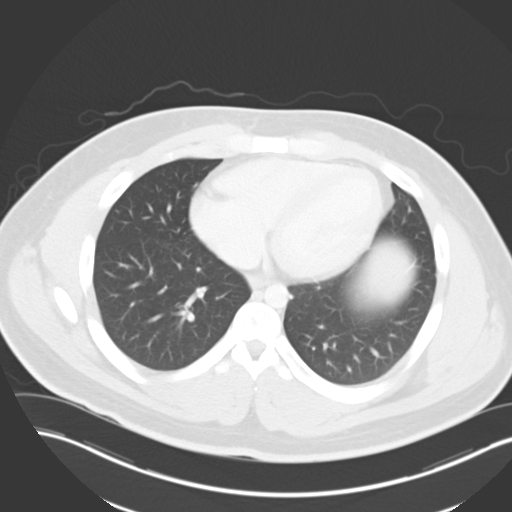

[15 of 32 positions shown; findings below may reference images not displayed]

FINDINGS: Lung bases clear.

Liver, spleen, pancreas, kidneys, and adrenal glands normal
appearance.

Fluid filled normal upper normal caliber small bowel loops
throughout abdomen.

Scattered fluid throughout colon.

No hydronephrosis or ureteral dilatation.

Bladder unremarkable.

Appendix not visualized but no pericecal inflammatory process
identified.

No mass, adenopathy, free fluid or free air.

Bones unremarkable.
IMPRESSION: Nonspecific fluid filled nondistended large and small bowel loops
throughout abdomen.

No other intra-abdominal or intrapelvic abnormalities identified.
